# Patient Record
Sex: Male | Born: 1950 | Race: White | Hispanic: No | Marital: Married | Smoking: Never smoker
Health system: Southern US, Community
[De-identification: ages and names within clinical notes are randomized; demographics above are authoritative.]

## PROBLEM LIST (undated history)

## (undated) DIAGNOSIS — I4891 Unspecified atrial fibrillation: Secondary | ICD-10-CM

## (undated) DIAGNOSIS — F419 Anxiety disorder, unspecified: Secondary | ICD-10-CM

## (undated) DIAGNOSIS — J45909 Unspecified asthma, uncomplicated: Secondary | ICD-10-CM

## (undated) DIAGNOSIS — K219 Gastro-esophageal reflux disease without esophagitis: Secondary | ICD-10-CM

## (undated) DIAGNOSIS — G35D Multiple sclerosis, unspecified: Secondary | ICD-10-CM

## (undated) DIAGNOSIS — G35 Multiple sclerosis: Secondary | ICD-10-CM

## (undated) HISTORY — DX: Multiple sclerosis, unspecified: G35.D

## (undated) HISTORY — PX: EYE SURGERY: SHX253

## (undated) HISTORY — DX: Unspecified asthma, uncomplicated: J45.909

## (undated) HISTORY — DX: Unspecified atrial fibrillation: I48.91

## (undated) HISTORY — DX: Anxiety disorder, unspecified: F41.9

## (undated) HISTORY — DX: Multiple sclerosis: G35

## (undated) HISTORY — PX: HERNIA REPAIR: SHX51

## (undated) HISTORY — DX: Gastro-esophageal reflux disease without esophagitis: K21.9

## (undated) HISTORY — PX: SHOULDER SURGERY: SHX246

---

## 2000-04-19 DIAGNOSIS — K5732 Diverticulitis of large intestine without perforation or abscess without bleeding: Secondary | ICD-10-CM | POA: Insufficient documentation

## 2003-10-29 DIAGNOSIS — K589 Irritable bowel syndrome without diarrhea: Secondary | ICD-10-CM | POA: Insufficient documentation

## 2005-10-12 ENCOUNTER — Ambulatory Visit: Payer: Self-pay | Admitting: Internal Medicine

## 2005-10-20 ENCOUNTER — Ambulatory Visit: Payer: Self-pay | Admitting: Cardiology

## 2005-11-04 ENCOUNTER — Ambulatory Visit: Payer: Self-pay | Admitting: Internal Medicine

## 2006-04-08 DIAGNOSIS — G35 Multiple sclerosis: Secondary | ICD-10-CM | POA: Insufficient documentation

## 2006-12-06 ENCOUNTER — Ambulatory Visit: Payer: Self-pay | Admitting: Internal Medicine

## 2008-03-29 ENCOUNTER — Ambulatory Visit: Payer: Self-pay | Admitting: Internal Medicine

## 2008-04-04 ENCOUNTER — Ambulatory Visit: Payer: Self-pay | Admitting: Cardiology

## 2009-03-21 DIAGNOSIS — I480 Paroxysmal atrial fibrillation: Secondary | ICD-10-CM | POA: Insufficient documentation

## 2009-03-22 ENCOUNTER — Encounter (INDEPENDENT_AMBULATORY_CARE_PROVIDER_SITE_OTHER): Payer: Self-pay | Admitting: *Deleted

## 2009-03-22 ENCOUNTER — Ambulatory Visit: Payer: Self-pay | Admitting: Internal Medicine

## 2009-03-22 DIAGNOSIS — R030 Elevated blood-pressure reading, without diagnosis of hypertension: Secondary | ICD-10-CM | POA: Insufficient documentation

## 2009-03-22 DIAGNOSIS — G5600 Carpal tunnel syndrome, unspecified upper limb: Secondary | ICD-10-CM | POA: Insufficient documentation

## 2010-04-01 ENCOUNTER — Encounter: Payer: Self-pay | Admitting: Internal Medicine

## 2010-04-03 ENCOUNTER — Encounter: Payer: Self-pay | Admitting: Internal Medicine

## 2010-04-03 ENCOUNTER — Ambulatory Visit: Payer: Self-pay | Admitting: Internal Medicine

## 2010-04-03 DIAGNOSIS — K219 Gastro-esophageal reflux disease without esophagitis: Secondary | ICD-10-CM | POA: Insufficient documentation

## 2010-05-15 NOTE — Assessment & Plan Note (Signed)
Summary: YEARLY/SL   Visit Type:  1 year follow up Primary Provider:  prince, dan  CC:  headache-occ.  History of Present Illness: Nicholas Bolton is seen in followup for paroxysmal atrial fibrillation is primarily nocturnal. He has been taking Rythmol and has tolerated this without any difficulty. The patient denies SOB, chest pain, edema  His major problem has been GERD which was aggravated by taking Propafenone  2 times a day so he hasigone back to taking it 3 times a day     At his last visit there is a concern about chest pain. Underwent a Myoview scanning done at [facility] last December2009, with norm,al perfusion  Saw neurologist,  MRI question MS vs HTN chnages  Problems Prior to Update: 1)  Carpal Tunnel Syndrome  (ICD-354.0) 2)  Increased Blood Pressure  (ICD-796.2) 3)  Atrial Fibrillation, Paroxysmal  (ICD-427.31)  Current Medications (verified): 1)  Propafenone Hcl 150 Mg Tabs (Propafenone Hcl) .... Take One Tablet Three Times A Day 2)  Loratadine 10 Mg Tabs (Loratadine) .... Take One Tablet Once Daily 3)  Aspirin 81 Mg Tabs (Aspirin) .... Take One Tablet Daily 4)  Diazepam 5 Mg Tabs (Diazepam) .... As Needed 5)  Betaseron 0.3 Mg Solr (Interferon Beta-1b) .... Every Other Day, At Bedtime 6)  Finacea Plus 15 % Kit (Azelaic Acid-Cleanser-Lotion) .... Two Times A Day 7)  Vitamin D 1000 Unit Tabs (Cholecalciferol) .... Take 2000 International Units Two Times A Day 8)  B Complex-Vitamin B12  Tabs (B Complex-Folic Acid) .... Once Daily 9)  Muro 128 5 % Oint (Sodium Chloride (Hypertonic)) .... Once Daily At Bedtime  Allergies (verified): 1)  ! Pcn 2)  ! Keflex 3)  ! Procainamide  Past History:  Past Medical History: Last updated: 03/22/2009 Atrial fibrillation Normal stress echo ; normal nuclear study December 2009 Multiple Sclerosis Asthma GERD Anxiety  Past Surgical History: Last updated: 03/21/2009 Hernia Eye surgery Shoulder surgery  Family  History: Last updated: 03/22/2009 Negative FH of Diabetes or Coronary Artery Disease  Social History: Last updated: 03/21/2009 CEO of a [employer] Married  Tobacco Use - No.  Alcohol Use - yes Drug Use - no  Risk Factors: Smoking Status: never (03/21/2009)   Vital Signs:  Patient profile:   60 year old male Height:      69 inches Weight:      169.50 pounds BMI:     25.12 Pulse rate:   57 / minute BP sitting:   149 / 99  (left arm) Cuff size:   regular  Vitals Entered By: Caralee Ates CMA (April 03, 2010 8:44 AM)  Physical Exam  General:  The patient was alert and oriented in no acute distress. HEENT Normal.  Neck veins were flat, carotids were brisk.  Lungs were clear.  Heart sounds were regular without murmurs or gallops.  Abdomen was soft with active bowel sounds. There is no clubbing cyanosis or edema. Skin Warm and dry    Impression & Recommendations:  Problem # 1:  ATRIAL FIBRILLATION, PAROXYSMAL (ICD-427.31) his atrial fibrillation and remains paroxysmal we will continue him on his Rythmol. His CHADS VASC score is one; hence we will continue him on aspirin The following medications were removed from the medication list:    Rythmol Sr 225 Mg Xr12h-cap (Propafenone hcl) .[name]..[name] Two times a day His updated medication list for this problem includes:    Propafenone Hcl 150 Mg Tabs (Propafenone hcl) .[name]... Take one tablet three times a day  Aspirin 81 Mg Tabs (Aspirin) .[name]... Take one tablet daily  Orders: EKG w/ Interpretation (93000)  Problem # 2:  GERD (ICD-530.81) we discussed the potential for taking over-the-counter H2 blockers and PPIs. He will resume the latter  Problem # 3:  INCREASED BLOOD PRESSURE (ICD-796.2) His blood pressure significantly elevated today with a repeat systolic of 156. He says it was 120 at his last visit. He reminds me that he has a history of hypertension dating back to [place] draft days. I've asked him to take a half  dozen measurements and see his primary care physician next month  Patient Instructions: 1)  Your physician recommends that you continue on your current medications as directed. Please refer to the Current Medication list given to you today. 2)  Your physician wants you to follow-up in:1 year  You will receive a reminder letter in the mail two months in advance. If you don't receive a letter, please call our office to schedule the follow-up appointment.  Appended Document: YEARLY/SL sinus rhythm at 51 Intervals 0.22/0.10/141 Axis is -18

## 2010-08-26 NOTE — Letter (Signed)
December 06, 2006    [name], MD  [address]  [place], [place]  [address]   RE:  ISAM, UNREIN  MRN:  [id number]  /  DOB:  Aug 28, 1950   Dear Jesusita Oka:   It was a pleasure to see your friend and patient, Nicholas Bolton, today.  He told me the saga of his multiple sclerosis and that seems to be doing  pretty well. His atrial fibrillation is quite quiescent on his  propafenone. He takes aspirin.   On examination, his blood pressure was 122/72, pulse 53.  LUNGS:  Clear.  HEART: Sounds were regular.  EXTREMITIES: Were without edema.   IMPRESSION:  1. Paroxysmal atrial fibrillation.  2. Normal stress echo.  3. Intracurrent diagnosis of multiple sclerosis.   Mr. Yett is doing well. Will plan to see him again in a year and  continue on his propafenone.    Sincerely,      Duke Salvia, MD, [facility]  Electronically Signed    SCK/MedQ  DD: 12/06/2006  DT: 12/06/2006  Job #: [id number]

## 2010-08-26 NOTE — Letter (Signed)
March 29, 2008    Nicholas Minion, MD  [address]  [place], [place] [address]   RE:  Nicholas Bolton, Nicholas Bolton  MRN:  [id number]  /  DOB:  [birth year]   Dear Nicholas Bolton:   Hope this letter finds you well and hope you and your family have a  wonderful time at Christmas.   Nicholas Bolton comes in.  His atrial fibrillation has been quiescent on  propafenone.  He had an episode of chest pain that was protracted on  October 16 and went to the [facility] Room where after about  an hour and half, he was discharged because electrocardiogram was  normal.  His initial sets of enzymes were normal.   He has not had any discomfort since this episode in the emergency room.  He was not relieved by nitro, GI cocktail, or Ativan.  It is noted he  has had no recurrence.   Electrocardiogram today is unchanged.   MEDICATIONS:  1. Propafenone 150 t.i.d.  2. Loratadine.  3. Diazepam.   PHYSICAL EXAMINATION:  PHYSICAL EXAMINATION:  His blood pressure was  mildly elevated at 140/80, his pulse was 55.  NECK:  Veins were flat.  LUNGS:  Clear.  HEART:  Sounds were regular.  ABDOMEN:  Soft.  EXTREMITIES:  Without edema.   Electrocardiogram was normal as noted.   IMPRESSION:  1. Atrial fibrillation on propafenone.  2. Protracted chest pain with a stress echo couple of years ago.   Nicholas Bolton, I think the big issue with Nicholas Bolton and his protracted chest pain  in the setting of 1-C antiarrhythmic therapy.  He states he does have  underlying coronary disease with the episode of manifestation of such.  I think it was reasonable to repeat his stress test at this point with a  low threshold to undertake catheterization even if it is abnormal.   If there is anything that I can do further to help, please do not  hesitate to contact me.    Sincerely,      Nicholas Salvia, MD, [facility]  Electronically Signed    SCK/MedQ  DD: 03/29/2008  DT: 03/30/2008  Job #: [id number]

## 2012-01-08 ENCOUNTER — Encounter: Payer: Self-pay | Admitting: *Deleted

## 2012-01-08 ENCOUNTER — Encounter: Payer: Self-pay | Admitting: Internal Medicine

## 2012-03-14 ENCOUNTER — Ambulatory Visit: Payer: Self-pay | Admitting: Internal Medicine

## 2012-05-05 ENCOUNTER — Encounter: Payer: Self-pay | Admitting: Internal Medicine

## 2012-05-05 ENCOUNTER — Ambulatory Visit (INDEPENDENT_AMBULATORY_CARE_PROVIDER_SITE_OTHER): Payer: No Typology Code available for payment source | Admitting: Internal Medicine

## 2012-05-05 VITALS — BP 151/80 | HR 54 | Ht 69.5 in | Wt 162.0 lb

## 2012-05-05 DIAGNOSIS — I4891 Unspecified atrial fibrillation: Secondary | ICD-10-CM

## 2012-05-05 NOTE — Assessment & Plan Note (Signed)
Continue on his current medical regime. We discussed alternative formulations.. I would like to continue it is doing. he will also continue on low-dose aspirin.

## 2012-05-05 NOTE — Progress Notes (Signed)
Patient Care Team: Mirian Mo as PCP - General (Internal Medicine)   HPI  Nicholas Bolton is a 62 y.o. male Seen in followup for paroxysmal atrial fibrillation. He was last seen about 2-1/2 years ago. He was taking Rythmol at that time. He continues to take it 3 times a day. This regime seems to minimize GI discomfort. He's had no significant atrial fibrillation.  The patient denies SOB, chest pain edema or palpitations.  There has been no syncope or presyncope.   [facility]oview scanning [facility] at [facility] was normal.  Past Medical History  Diagnosis Date  . A-fib   . MS (multiple sclerosis)   . Asthma   . GERD (gastroesophageal reflux disease)   . Anxiety     Past Surgical History  Procedure Date  . Hernia repair   . Eye surgery   . Shoulder surgery     Current Outpatient Prescriptions  Medication Sig Dispense Refill  . aspirin 81 MG tablet Take 81 mg by mouth daily.      . Azelaic Acid-Cleanser-Lotion (FINACEA PLUS) 15 % KIT Apply topically.      . Cholecalciferol (VITAMIN D3) LIQD Inject as directed as directed.      . Cyanocobalamin (VITAMIN B-12 PO) Take by mouth.      . interferon beta-1b (BETASERON) 0.3 MG injection Inject 0.25 mg into the skin every other day.      . loratadine (CLARITIN) 10 MG tablet Take 10 mg by mouth daily.      [name] omeprazole (PRILOSEC) 20 MG capsule Take 20 mg by mouth daily.      . propafenone (RYTHMOL) 150 MG tablet Take 150 mg by mouth every 8 (eight) hours.      . sodium chloride (MURO 128) 5 % ophthalmic solution 1 drop as needed.        Allergies  Allergen Reactions  . Cephalexin   . Penicillins   . Procainamide     Review of Systems negative except from HPI and PMH  Physical Exam BP 151/80  Pulse 54  Ht 5' 9.5" (1.765 m)  Wt 162 lb (73.483 kg)  BMI 23.58 kg/m2 Well developed and nourished in no acute distress HENT normal Neck supple with JVP-flat Clear Regular rate and rhythm, no murmurs or gallops Abd-soft with active  BS No Clubbing cyanosis edema Skin-warm and dry A & Oriented  Grossly normal sensory and motor function  1 ECG demonstrates sinus rhythm at 51 Intervals 19/11/44 Axis XIII  Assessment and  Plan

## 2012-05-05 NOTE — Patient Instructions (Addendum)
Your physician wants you to follow-up in: 1 year with Dr Klein.  You will receive a reminder letter in the mail two months in advance. If you don't receive a letter, please call our office to schedule the follow-up appointment.  

## 2013-01-25 DIAGNOSIS — K633 Ulcer of intestine: Secondary | ICD-10-CM | POA: Insufficient documentation

## 2013-08-24 DIAGNOSIS — D72819 Decreased white blood cell count, unspecified: Secondary | ICD-10-CM | POA: Insufficient documentation

## 2014-01-23 ENCOUNTER — Ambulatory Visit (INDEPENDENT_AMBULATORY_CARE_PROVIDER_SITE_OTHER): Payer: BC Managed Care – PPO | Admitting: Internal Medicine

## 2014-01-23 ENCOUNTER — Encounter: Payer: Self-pay | Admitting: Internal Medicine

## 2014-01-23 VITALS — BP 142/89 | HR 54 | Ht 69.0 in | Wt 162.2 lb

## 2014-01-23 DIAGNOSIS — I480 Paroxysmal atrial fibrillation: Secondary | ICD-10-CM

## 2014-01-23 NOTE — Patient Instructions (Signed)
Your physician recommends that you continue on your current medications as directed. Please refer to the Current Medication list given to you today.  Your physician wants you to follow-up in: 1 year with Dr. Caryl Comes.  You will receive a reminder letter in the mail two months in advance. If you don't receive a letter, please call our office to schedule the follow-up appointment.  Dr. Caryl Comes recommends you use the AliveCor monitor.

## 2014-01-23 NOTE — Progress Notes (Signed)
Patient Care Team: Bonita Quin, MD as PCP - General (Internal Medicine)   HPI  Nicholas Bolton is a 63 y.o. male Seen in followup for paroxysmal atrial fibrillation. He was last seen about 2-1/2 years ago. He was taking Rythmol at that time. He continues to take it 3 times a day. This regime seems to minimize GI discomfort. He's had no significant atrial fibrillation.  He had a spell  A few weeks ago with palpitations that were sporadic   The patient denies SOB, chest pain edema  There has been no syncope or presyncope.   Myoview scanning 2009 at work hospital was normal.  Past Medical History  Diagnosis Date  . A-fib   . MS (multiple sclerosis)   . Asthma   . GERD (gastroesophageal reflux disease)   . Anxiety     Past Surgical History  Procedure Laterality Date  . Hernia repair    . Eye surgery    . Shoulder surgery      Current Outpatient Prescriptions  Medication Sig Dispense Refill  . aspirin 81 MG tablet Take 81 mg by mouth daily.      . Azelaic Acid-Cleanser-Lotion (FINACEA PLUS) 15 % KIT Apply topically.      . B Complex-Folic Acid (B COMPLEX-VITAMIN B12 PO) Take 1 tablet by mouth daily.      . Cholecalciferol (VITAMIN D-3 PO) Take 4,000 tablets by mouth every other day.      . interferon beta-1b (BETASERON) 0.3 MG injection Inject 0.25 mg into the skin every other day.      . loratadine (CLARITIN) 10 MG tablet Take 10 mg by mouth daily.      [name] omeprazole (PRILOSEC) 20 MG capsule Take 20 mg by mouth daily.      . propafenone (RYTHMOL) 150 MG tablet Take 150 mg by mouth every 8 (eight) hours.      . sodium chloride (MURO 128) 5 % ophthalmic solution 1 drop as needed.       No current facility-administered medications for this visit.    Allergies  Allergen Reactions  . Penicillins     Blood in urine   . Procainamide     Nightmares and made feel like a vice on head.  . Cephalexin     Rash     Review of Systems negative except from HPI and  PMH  Physical Exam BP 142/89  Pulse 54  Ht _0  (1.753 m)  Wt 162 lb 3.2 oz (73.573 kg)  BMI 23.94 kg/m2 Well developed and nourished in no acute distress HENT normal Neck supple with JVP-flat Clear Regular rate and rhythm, no murmurs or gallops Abd-soft with active BS No Clubbing cyanosis edema Skin-warm and dry A & Oriented  Grossly normal sensory and motor function  1 ECG demonstrates sinus rhythm at 51 Intervals 19/11/44 Axis XIII  Assessment and  Plan  Atrial fibrillation  Palpitations  Suggested using alivecor for monitoring and intentional exercise routine

## 2014-02-13 ENCOUNTER — Encounter: Payer: Self-pay | Admitting: *Deleted

## 2014-08-29 DIAGNOSIS — E041 Nontoxic single thyroid nodule: Secondary | ICD-10-CM | POA: Insufficient documentation

## 2014-09-12 DIAGNOSIS — E0590 Thyrotoxicosis, unspecified without thyrotoxic crisis or storm: Secondary | ICD-10-CM | POA: Insufficient documentation

## 2014-09-24 DIAGNOSIS — E042 Nontoxic multinodular goiter: Secondary | ICD-10-CM | POA: Insufficient documentation

## 2014-11-08 DIAGNOSIS — D649 Anemia, unspecified: Secondary | ICD-10-CM | POA: Insufficient documentation

## 2015-05-23 ENCOUNTER — Ambulatory Visit: Payer: Self-pay | Admitting: Internal Medicine

## 2015-07-02 ENCOUNTER — Encounter: Payer: Self-pay | Admitting: Internal Medicine

## 2015-07-02 ENCOUNTER — Ambulatory Visit (INDEPENDENT_AMBULATORY_CARE_PROVIDER_SITE_OTHER): Payer: Medicare Other | Admitting: Internal Medicine

## 2015-07-02 VITALS — BP 161/93 | HR 62 | Ht 69.0 in | Wt 165.8 lb

## 2015-07-02 DIAGNOSIS — I480 Paroxysmal atrial fibrillation: Secondary | ICD-10-CM

## 2015-07-02 DIAGNOSIS — E782 Mixed hyperlipidemia: Secondary | ICD-10-CM | POA: Diagnosis not present

## 2015-07-02 MED ORDER — ATORVASTATIN CALCIUM 40 MG PO TABS: 40.0000 mg | ORAL_TABLET | Freq: Every day | ORAL | Status: DC

## 2015-07-02 MED ORDER — LISINOPRIL 5 MG PO TABS: 5.0000 mg | ORAL_TABLET | Freq: Every day | ORAL | Status: DC

## 2015-07-02 NOTE — Progress Notes (Signed)
Patient Care Team: No Pcp Per Patient as PCP - General (General Practice)   HPI  Nicholas Bolton is a 65 y.o. male Seen in followup for paroxysmal atrial fibrillation. He was last seen about 2-1/2 years ago. He was taking Rythmol at that time. He continues to take it 3 times a day. This regime seems to minimize GI discomfort. He's had no significant atrial fibrillation.  He had a spell  A few weeks ago with palpitations that were sporadic   The patient denies SOB, chest pain edema  There has been no syncope or presyncope.  Some palps and review of Alive Cor suggests non sustained Atrial tacjk  Myoview scanning 2009 at work hospital was normal.  Past Medical History  Diagnosis Date  . A-fib ([place])   . MS (multiple sclerosis) ([place])   . Asthma   . GERD (gastroesophageal reflux disease)   . Anxiety     Past Surgical History  Procedure Laterality Date  . Hernia repair    . Eye surgery    . Shoulder surgery      Current Outpatient Prescriptions  Medication Sig Dispense Refill  . Azelaic Acid-Cleanser-Lotion (FINACEA PLUS) 15 % KIT Apply topically.    . B Complex-Folic Acid (B COMPLEX-VITAMIN B12 PO) Take 1 tablet by mouth daily.    . Cholecalciferol (VITAMIN D-3 PO) Take 4,000 Units by mouth every other day.     . loratadine (CLARITIN) 10 MG tablet Take 10 mg by mouth daily.    [name] omeprazole (PRILOSEC) 20 MG capsule Take 20 mg by mouth daily.    . propafenone (RYTHMOL) 150 MG tablet Take 150 mg by mouth every 8 (eight) hours.    . sodium chloride (MURO 128) 5 % ophthalmic solution 1 drop as needed.     No current facility-administered medications for this visit.    Allergies  Allergen Reactions  . Penicillins     Blood in urine   . Procainamide Rash    "pt went crazy" Nightmares and made feel like a vice on head.  . Clindamycin Hives  . Penicillin G Other (See Comments)    hematuria  . Cephalexin Other (See Comments) and Rash    Rash Rash     Review of Systems  negative except from HPI and PMH  Physical Exam BP 161/93 mmHg  Pulse 62  Ht _0  (1.753 m)  Wt 165 lb 12.8 oz (75.206 kg)  BMI 24.47 kg/m2 Well developed and nourished in no acute distress HENT normal Neck supple with JVP-flat Clear Regular rate and rhythm, no murmurs or gallops Abd-soft with active BS No Clubbing cyanosis edema Skin-warm and dry A & Oriented  Grossly normal sensory and motor function    Assessment and  Plan  Atrial fibrillation  Palpitations Hypertension  Hyperlipidemia   Labs reviewed and 10 yr calculator 18.8 % from labs 2 yrs ago  Will begin moderate dose statin  atorva 40  BP elevated  willadd lisinopril 10 will check bmet and LP with direct LDL today

## 2015-07-02 NOTE — Patient Instructions (Signed)
Medication Instructions: 1) Start lisinopril 5 mg one tablet by mouth once daily 2) Start lipitor 40 mg one tablet by mouth once daily  Labwork: - Your physician recommends that you return for lab work today: lipid/ direct LDL  Procedures/Testing: - none  Follow-Up: - Your physician wants you to follow-up in: 1 year with Dr. Caryl Comes. You will receive a reminder letter in the mail two months in advance. If you don't receive a letter, please call our office to schedule the follow-up appointment.  Any Additional Special Instructions Will Be Listed Below (If Applicable).     If you need a refill on your cardiac medications before your next appointment, please call your pharmacy.

## 2015-07-03 LAB — LIPID PANEL
Cholesterol: 203 mg/dL — ABNORMAL HIGH (ref 125–200)
HDL: 56 mg/dL (ref >=40)
LDL Cholesterol: 119 mg/dL (ref <130)
Total CHOL/HDL Ratio: 3.6 Ratio (ref <=5.0)
Triglycerides: 139 mg/dL (ref <150)
VLDL: 28 mg/dL (ref <30)

## 2015-07-03 LAB — BASIC METABOLIC PANEL
BUN: 17 mg/dL (ref 7–25)
CO2: 24 mmol/L (ref 20–31)
Calcium: 9.4 mg/dL (ref 8.6–10.3)
Chloride: 102 mmol/L (ref 98–110)
Creat: 0.96 mg/dL (ref 0.70–1.25)
Glucose, Bld: 84 mg/dL (ref 65–99)
Potassium: 4.3 mmol/L (ref 3.5–5.3)
Sodium: 137 mmol/L (ref 135–146)

## 2015-07-03 LAB — LDL CHOLESTEROL, DIRECT: Direct LDL: 129 mg/dL (ref <130)

## 2015-07-24 ENCOUNTER — Encounter: Payer: Self-pay | Admitting: Internal Medicine

## 2016-06-11 ENCOUNTER — Other Ambulatory Visit: Payer: Self-pay | Admitting: Internal Medicine

## 2017-04-14 ENCOUNTER — Ambulatory Visit: Payer: Medicare Other | Admitting: Internal Medicine

## 2017-04-15 ENCOUNTER — Encounter: Payer: Self-pay | Admitting: Internal Medicine

## 2017-04-15 ENCOUNTER — Ambulatory Visit (INDEPENDENT_AMBULATORY_CARE_PROVIDER_SITE_OTHER): Payer: Medicare Other | Admitting: Internal Medicine

## 2017-04-15 VITALS — BP 146/82 | HR 56 | Ht 68.0 in | Wt 162.0 lb

## 2017-04-15 DIAGNOSIS — K649 Unspecified hemorrhoids: Secondary | ICD-10-CM | POA: Insufficient documentation

## 2017-04-15 DIAGNOSIS — K4090 Unilateral inguinal hernia, without obstruction or gangrene, not specified as recurrent: Secondary | ICD-10-CM | POA: Insufficient documentation

## 2017-04-15 DIAGNOSIS — I480 Paroxysmal atrial fibrillation: Secondary | ICD-10-CM

## 2017-04-15 DIAGNOSIS — M5137 Other intervertebral disc degeneration, lumbosacral region: Secondary | ICD-10-CM | POA: Insufficient documentation

## 2017-04-15 DIAGNOSIS — L719 Rosacea, unspecified: Secondary | ICD-10-CM | POA: Insufficient documentation

## 2017-04-15 NOTE — Progress Notes (Signed)
Patient Care Team: Patient, No Pcp Per as PCP - General (General Practice)   HPI  Nicholas Bolton is a 67 y.o. male Seen in followup for paroxysmal atrial fibrillation. He was last seen about 2-1/2 years ago. He was taking Rythmol at that time. He continues to take it 3 times a day. This regime seems to minimize GI discomfort. He's had no significant atrial fibrillation.  Scant palpitations  No exercise intolerance, but now having significant problems with back pain which is limiting  No sob or chest pain, but wife has noted increasing edema; no pnd or orthopnea   Denies added salt, and volume intake in not excessive   Also complaining of bruising of hands,  Apparently CBC is normal  Myoview scanning 2009 at work hospital was normal.  Past Medical History:  Diagnosis Date  . A-fib ([place])   . Anxiety   . Asthma   . GERD (gastroesophageal reflux disease)   . MS (multiple sclerosis) ([place])     Past Surgical History:  Procedure Laterality Date  . EYE SURGERY    . HERNIA REPAIR    . SHOULDER SURGERY      Current Outpatient Medications  Medication Sig Dispense Refill  . Azelaic Acid-Cleanser-Lotion (FINACEA PLUS) 15 % KIT Apply topically.    . B Complex-Folic Acid (B COMPLEX-VITAMIN B12 PO) Take 1 tablet by mouth daily.    . Cholecalciferol (VITAMIN D-3 PO) Take 4,000 Units by mouth every other day.     . lisinopril (PRINIVIL,ZESTRIL) 5 MG tablet Take 1 tablet (5 mg total) by mouth daily. *Please call and schedule a one year follow up appointment* 90 tablet 0  . loratadine (CLARITIN) 10 MG tablet Take 10 mg by mouth daily.    [name] omeprazole (PRILOSEC) 20 MG capsule Take 20 mg by mouth daily.    . propafenone (RYTHMOL) 150 MG tablet Take 150 mg by mouth every 8 (eight) hours.    . sodium chloride (MURO 128) 5 % ophthalmic solution 1 drop as needed.     No current facility-administered medications for this visit.     Allergies  Allergen Reactions  . Penicillins     Blood in  urine   . Procainamide Rash    "pt went crazy" Nightmares and made feel like a vice on head.  . Clindamycin Hives  . Penicillin G Other (See Comments)    hematuria  . Cephalexin Other (See Comments) and Rash    Rash Rash     Review of Systems negative except from HPI and PMH  Physical Exam BP (!) 146/82   Pulse (!) 56   Ht 5' 8" (1.727 m)   Wt 162 lb (73.5 kg)   SpO2 98%   BMI 24.63 kg/m  Well developed and nourished in no acute distress HENT normal Neck supple with JVP-7-8 Clear Regular rate and rhythm, no murmurs or gallops Abd-soft with active BS No Clubbing cyanosis tr edema Skin-warm and dry A & Oriented  Grossly normal sensory and motor function  ECG  Sinus @ 51 21/11/45 -22  Assessment and  Plan  Atrial fibrillation-paroxysmal  Sinus bradycardia  Edema  Hypertension  Hyperlipidemia   BP reasonably controlled at home.  With evidence of mild volume overload, would consider switching to chlorthalidone, but he had previously developed hypokalemia-- after going back and forth, have settled on prn low dose furosemide  His back pain limits exercise, so not sure if his fluid overload is symptomatic or not  Would consider  Ca scoring in this asymptomatic man with a 5-20% to see if he is in the subset of score 0 in whom we could stop statins  I called him to discuss this on the phone and he is agreeable   More than 50% of 40 min was spent in counseling related to the above (including phone call)

## 2017-04-15 NOTE — Patient Instructions (Signed)
Medication Instructions: Your physician recommends that you continue on your current medications as directed. Please refer to the Current Medication list given to you today.  Labwork: None Ordered  Procedures/Testing: None Ordered  Follow-Up: Your physician wants you to follow-up in 1 YEAR with Dr. Klein. You will receive a reminder letter in the mail two months in advance. If you don't receive a letter, please call our office to schedule the follow-up appointment.   If you need a refill on your cardiac medications before your next appointment, please call your pharmacy.   

## 2017-05-14 ENCOUNTER — Telehealth: Payer: Self-pay | Admitting: *Deleted

## 2017-05-14 NOTE — Telephone Encounter (Signed)
-----   Message from Deboraha Sprang, MD sent at 04/15/2017  6:07 PM EST ----- H  He is going to call to get [place] to set up calcium score to see if he fits into the group who can COME OFF statin

## 2017-06-08 ENCOUNTER — Encounter: Payer: Self-pay | Admitting: Internal Medicine

## 2017-06-08 DIAGNOSIS — I480 Paroxysmal atrial fibrillation: Secondary | ICD-10-CM

## 2017-07-08 ENCOUNTER — Telehealth: Payer: Self-pay

## 2017-07-08 NOTE — Telephone Encounter (Signed)
Opened in error

## 2017-07-28 ENCOUNTER — Ambulatory Visit (INDEPENDENT_AMBULATORY_CARE_PROVIDER_SITE_OTHER)
Admission: RE | Admit: 2017-07-28 | Discharge: 2017-07-28 | Disposition: A | Discharge status: Home / Self Care | Payer: Medicare Other | Source: Ambulatory Visit | Attending: Internal Medicine | Admitting: Internal Medicine

## 2017-07-28 DIAGNOSIS — I480 Paroxysmal atrial fibrillation: Secondary | ICD-10-CM

## 2018-04-29 ENCOUNTER — Ambulatory Visit (INDEPENDENT_AMBULATORY_CARE_PROVIDER_SITE_OTHER): Payer: Medicare Other | Admitting: Internal Medicine

## 2018-04-29 ENCOUNTER — Encounter: Payer: Self-pay | Admitting: Internal Medicine

## 2018-04-29 VITALS — BP 126/82 | HR 51 | Ht 68.0 in | Wt 161.2 lb

## 2018-04-29 DIAGNOSIS — I480 Paroxysmal atrial fibrillation: Secondary | ICD-10-CM | POA: Diagnosis not present

## 2018-04-29 NOTE — Patient Instructions (Signed)
Medication Instructions: Your physician recommends that you continue on your current medications as directed. Please refer to the Current Medication list given to you today.   Labwork: None ordered today  Procedures/Testing: None ordered today  Follow-Up:  1 year with Dr. Klein  Any Additional Special Instructions Will Be Listed Below (If Applicable).     If you need a refill on your cardiac medications before your next appointment, please call your pharmacy.   

## 2018-04-29 NOTE — Progress Notes (Signed)
Patient Care Team: Eber Hong, MD as PCP - General (Internal Medicine)   HPI  Nicholas Bolton is a 68 y.o. male Seen in followup for paroxysmal atrial fibrillation takes Rythmol started 68 year old youngster then The patient denies chest pain, shortness of breath, nocturnal dyspnea, orthopnea  There have been no palpitations, lightheadedness or syncope.   Some end of the day edema.  Salt averse.  Some bruisability on his hands.  No anticoagulants.   Myoview scanning 2009 at work hospital was normal.   DATE PR interval QRSduration Propafenone   1/20 234 102 150 x3   Ca score 2  MS quiescient  Past Medical History:  Diagnosis Date  . A-fib ([place])   . Anxiety   . Asthma   . GERD (gastroesophageal reflux disease)   . MS (multiple sclerosis) ([place])     Past Surgical History:  Procedure Laterality Date  . EYE SURGERY    . HERNIA REPAIR    . SHOULDER SURGERY      Current Outpatient Medications  Medication Sig Dispense Refill  . atorvastatin (LIPITOR) 10 MG tablet Take 1 tablet by mouth daily.    . B Complex-Folic Acid (B COMPLEX-VITAMIN B12 PO) Take 1 tablet by mouth daily.    . Cholecalciferol (VITAMIN D3) 25 MCG (1000 UT) CAPS Take 2,000 Units by mouth daily.    . famotidine (PEPCID) 20 MG tablet Take 1 tablet by mouth daily.    [name] lisinopril (PRINIVIL,ZESTRIL) 5 MG tablet Take 1 tablet by mouth daily.    [name] loratadine (CLARITIN) 10 MG tablet Take 10 mg by mouth daily.    . propafenone (RYTHMOL) 150 MG tablet Take 150 mg by mouth every 8 (eight) hours.    . sodium chloride (MURO 128) 5 % ophthalmic ointment Place 0.5 application into both eyes at bedtime as needed.    . SOOLANTRA 1 % CREA Apply 1 application topically daily. to face     No current facility-administered medications for this visit.     Allergies  Allergen Reactions  . Penicillins     Blood in urine   . Procainamide Rash    "pt went crazy" Nightmares and made feel like a vice on head.  .  Clindamycin Hives  . Penicillin G Other (See Comments)    hematuria  . Cephalexin Other (See Comments) and Rash    Rash Rash     Review of Systems negative except from HPI and PMH  Physical Exam BP 126/82   Pulse (!) 51   Ht 5\' 8"  (1.727 m)   Wt 161 lb 3.2 oz (73.1 kg)   SpO2 99%   BMI 24.51 kg/m  Well developed and nourished in no acute distress HENT normal Neck supple with JVP-flat Clear Regular rate and rhythm, no murmurs or gallops Abd-soft with active BS No Clubbing cyanosis edema Skin-warm and dry A & Oriented  Grossly normal sensory and motor function   ECG  Sinus @ 52 23/10/43 Assessment and  Plan  Atrial fibrillation-paroxysmal  Sinus bradycardia  Edema  Hypertension  Hyperlipidemia    BP well controlled  No afib of note   Will need discuss anticoagulation

## 2019-05-08 IMAGING — CT CT HEART SCORING
2 series · 16 of 20 positions shown, 18 images · non-contrast
Comparison: None.

EXAM:
OVER-READ INTERPRETATION  CT CHEST

The following report is an over-read performed by radiologist Dr.
over-read does not include interpretation of cardiac or coronary
anatomy or pathology. The calcium score interpretation by the
cardiologist is attached.
CLINICAL DATA: Risk stratification
Coronary Calcium Score
TECHNIQUE: The patient was scanned on a Siemens Somatom 64 slice scanner. Axial
non-contrast 3mm slices were carried out through the heart. The data
set was analyzed on a dedicated work station and scored using the
Agatson method.

[Series 2: casc 3.0 i36f 2 bestdiast 67 % · axial · 0.39mm/px · z∈[-243,-138]mm · 8 of 47 slices shown, 10 images]
[im 6/47  vessel]
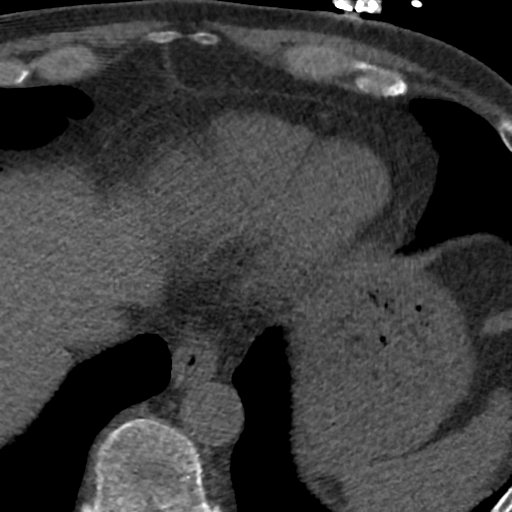
[im 6/47  lung]
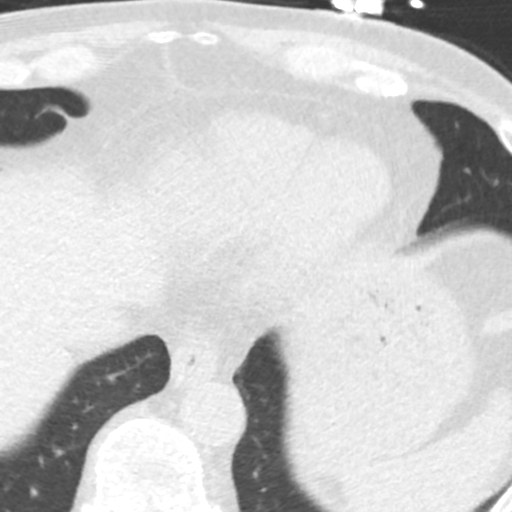
[im 11/47  vessel]
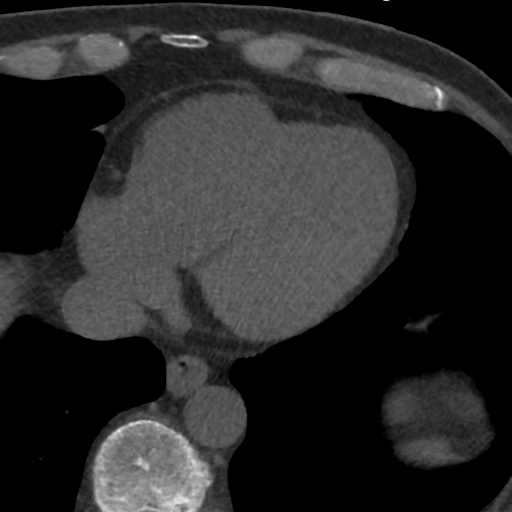
[im 16/47  vessel]
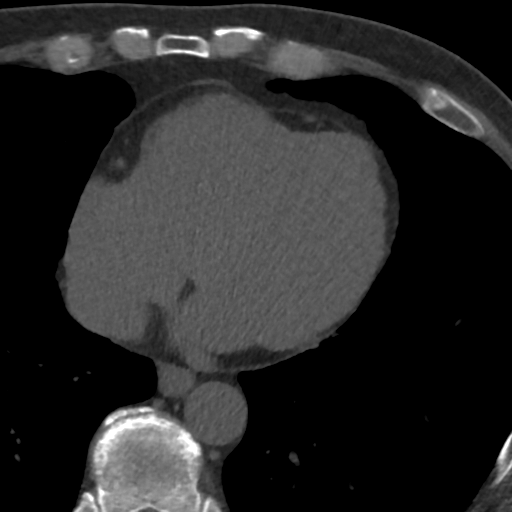
[im 21/47  vessel]
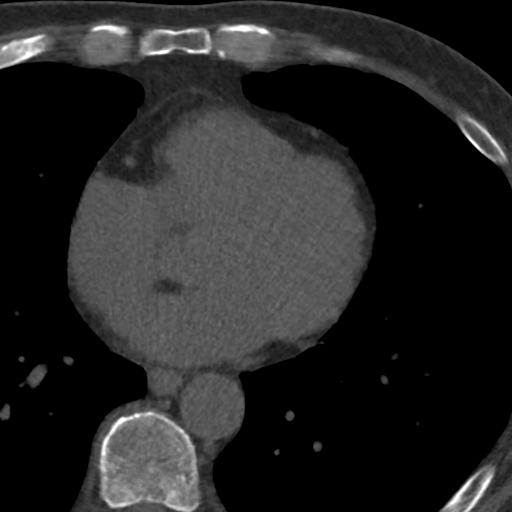
[im 26/47  vessel]
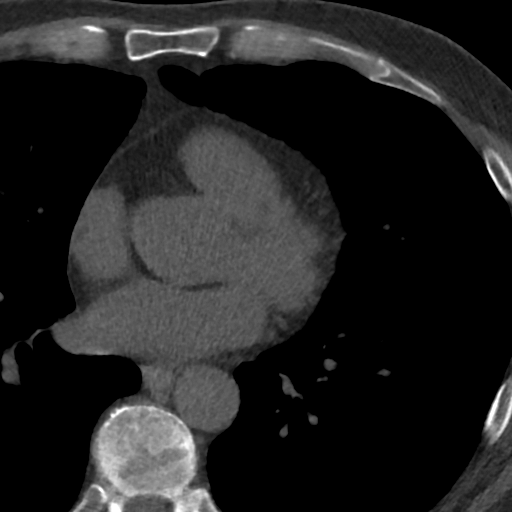
[im 26/47  lung]
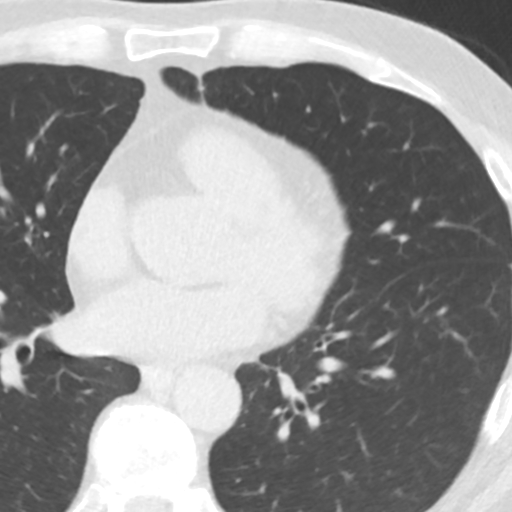
[im 31/47  vessel]
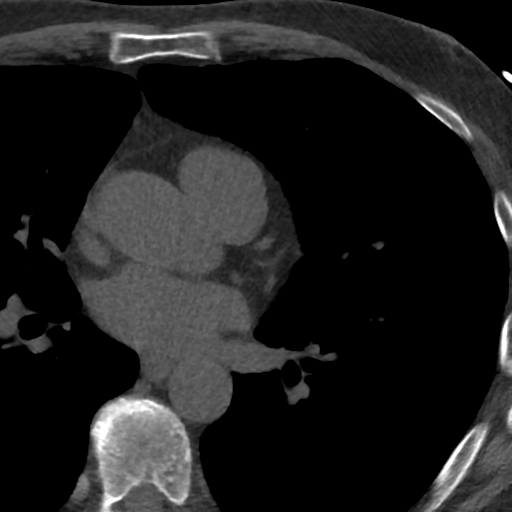
[im 36/47  vessel]
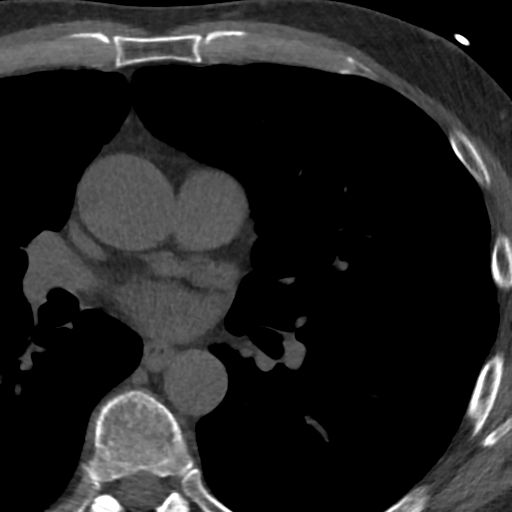
[im 41/47  vessel]
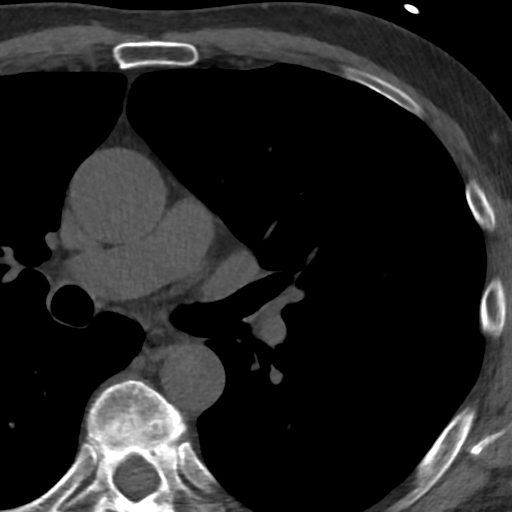

[Series 4: lung st 67 % · axial · 0.68mm/px · z∈[-246,-138]mm · 8 of 48 slices shown]
[im 6/48  lung]
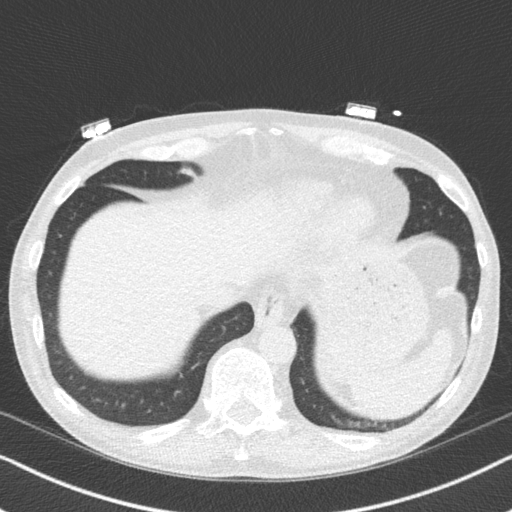
[im 11/48  lung]
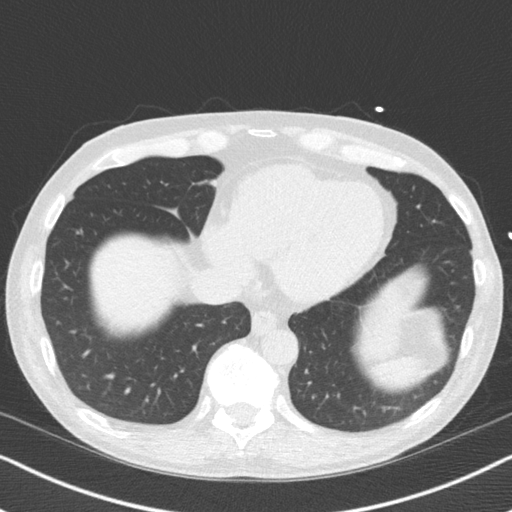
[im 16/48  lung]
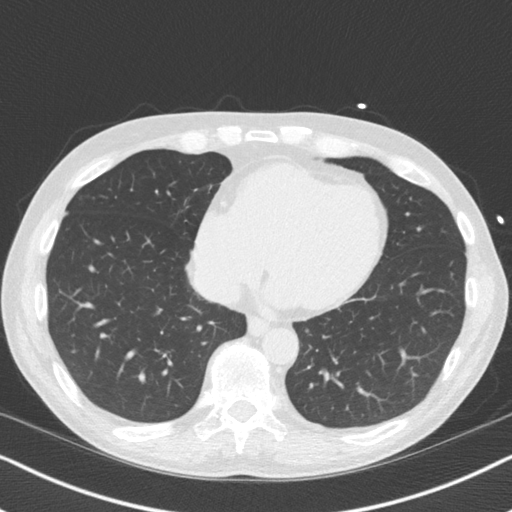
[im 21/48  lung]
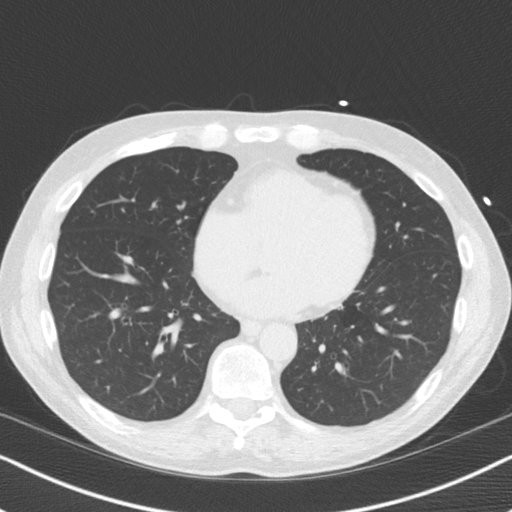
[im 27/48  lung]
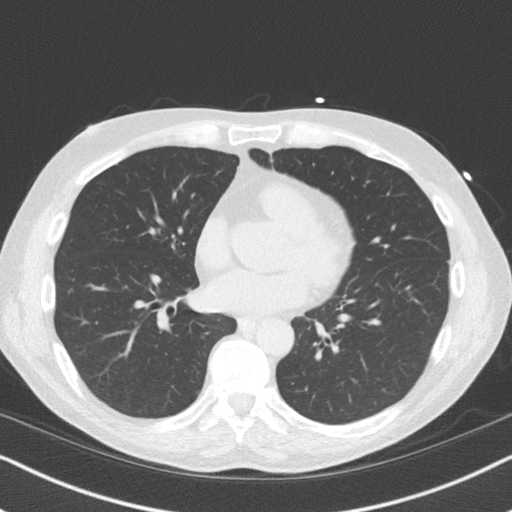
[im 32/48  lung]
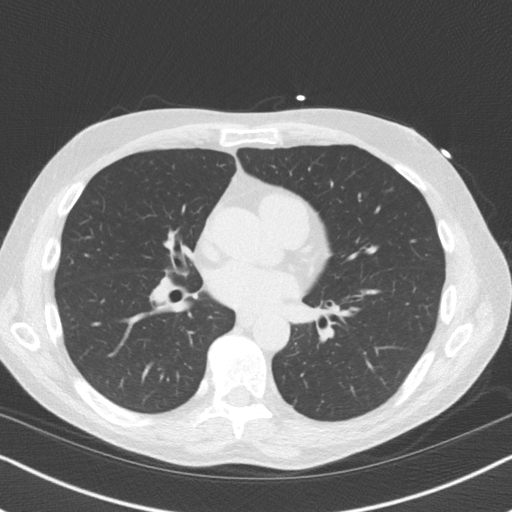
[im 37/48  lung]
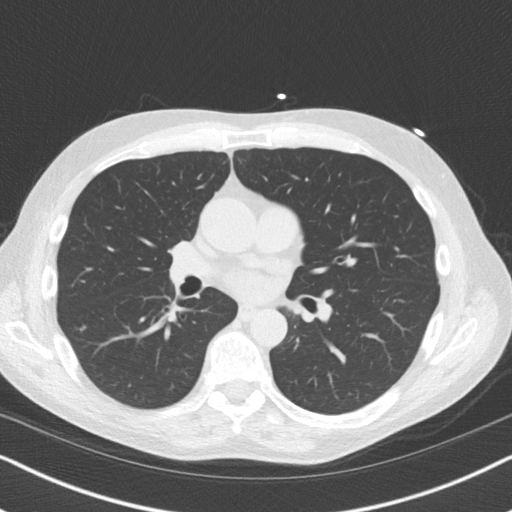
[im 42/48  lung]
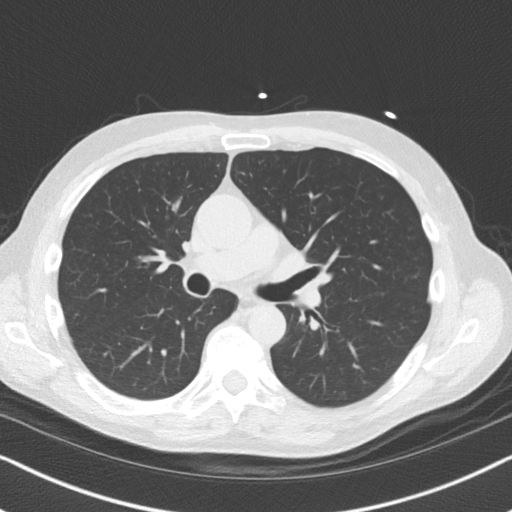

[16 of 20 positions shown; findings below may reference images not displayed]

FINDINGS: Limited view of the lung parenchyma demonstrates no suspicious
nodularity. Airways are normal.

Limited view of the mediastinum demonstrates no adenopathy.
Esophagus normal.

Limited view of the upper abdomen unremarkable.

Limited view of the skeleton and chest wall is unremarkable.
IMPRESSION: No significant extracardiac findings
FINDINGS: Non-cardiac: No significant non cardiac findings on limited lung and
soft tissue windows. See separate report from [REDACTED].

Ascending Aorta: Upper limits normal diameter 3.7 cm

Pericardium: Normal

Coronary arteries: One small punctate area of calcium noted in
proximal LAD and one in proximal RCA
IMPRESSION: Coronary calcium score of 2. This was 20 th percentile for age and
sex matched control.

Keturah Custodio

## 2019-11-13 ENCOUNTER — Ambulatory Visit (INDEPENDENT_AMBULATORY_CARE_PROVIDER_SITE_OTHER): Payer: Medicare Other | Admitting: Dermatology

## 2019-11-13 ENCOUNTER — Other Ambulatory Visit: Payer: Self-pay

## 2019-11-13 ENCOUNTER — Encounter: Payer: Self-pay | Admitting: Dermatology

## 2019-11-13 DIAGNOSIS — D225 Melanocytic nevi of trunk: Secondary | ICD-10-CM | POA: Diagnosis not present

## 2019-11-13 DIAGNOSIS — D485 Neoplasm of uncertain behavior of skin: Secondary | ICD-10-CM

## 2019-11-13 DIAGNOSIS — D229 Melanocytic nevi, unspecified: Secondary | ICD-10-CM

## 2019-11-13 NOTE — Progress Notes (Signed)
X 2 horns on left nostril. Will address at th next visit.

## 2019-11-13 NOTE — Patient Instructions (Signed)

## 2019-11-16 ENCOUNTER — Telehealth: Payer: Self-pay

## 2019-11-16 NOTE — Telephone Encounter (Signed)
-----   Message from [name], MD sent at 11/16/2019  6:58 AM EDT ----- Schedule surgery with Dr. Darene Lamer

## 2019-12-15 ENCOUNTER — Other Ambulatory Visit: Payer: Self-pay

## 2019-12-15 ENCOUNTER — Encounter: Payer: Self-pay | Admitting: Internal Medicine

## 2019-12-15 ENCOUNTER — Ambulatory Visit (INDEPENDENT_AMBULATORY_CARE_PROVIDER_SITE_OTHER): Payer: Medicare Other | Admitting: Internal Medicine

## 2019-12-15 VITALS — BP 128/80 | HR 58 | Ht 68.5 in | Wt 159.6 lb

## 2019-12-15 DIAGNOSIS — I480 Paroxysmal atrial fibrillation: Secondary | ICD-10-CM

## 2019-12-15 NOTE — Patient Instructions (Signed)
Medication Instructions:  Your physician recommends that you continue on your current medications as directed. Please refer to the Current Medication list given to you today.  *If you need a refill on your cardiac medications before your next appointment, please call your pharmacy*   Lab Work: none If you have labs (blood work) drawn today and your tests are completely normal, you will receive your results only by:  [place] (if you have MyChart) OR  A paper copy in the mail If you have any lab test that is abnormal or we need to change your treatment, we will call you to review the results.   Testing/Procedures: none   Follow-Up: At [facility], you and your health needs are our priority.  As part of our continuing mission to provide you with exceptional heart care, we have created designated Provider Care Teams.  These Care Teams include your primary Cardiologist (physician) and Advanced Practice Providers (APPs -  Physician Assistants and Nurse Practitioners) who all work together to provide you with the care you need, when you need it.  We recommend signing up for the patient portal called "MyChart".  Sign up information is provided on this After Visit Summary.  MyChart is used to connect with patients for Virtual Visits (Telemedicine).  Patients are able to view lab/test results, encounter notes, upcoming appointments, etc.  Non-urgent messages can be sent to your provider as well.   To learn more about what you can do with MyChart, go to [email or web address].    Your next appointment:   1 year(s)  The format for your next appointment:   In Person  Provider:   Virl Axe, MD   Other Instructions

## 2019-12-15 NOTE — Progress Notes (Signed)
Patient Care Team: Eber Hong, MD as PCP - General (Internal Medicine)   HPI  Nicholas Bolton is a 69 y.o. male Seen in followup for paroxysmal atrial fibrillation takes Rythmol started 1994   The patient denies chest pain, shortness of breath, nocturnal dyspnea, orthopne or peripheral edema  .  There have been no  lightheadedness or syncope.    Palpitations 4/21  ;lasted minutes, using apple watch>> AIVR   Thromboembolic risk factors ( age -77, HTN-2 ) for a CHADSVASc Score of >=2   Myoview scanning 2009 at work hospital was normal.   DATE PR interval QRSduration Propafenone   10/10 202 120 0  1/20 234 102 150 x3  9/21 222 124    Date Cr K TSH Hgb  7/20 1.07 4.4  14.2  2/21      4.22       Ca score 2  MS quiescient  Past Medical History:  Diagnosis Date  . A-fib ([place])   . Anxiety   . Asthma   . GERD (gastroesophageal reflux disease)   . MS (multiple sclerosis) ([name])     Past Surgical History:  Procedure Laterality Date  . EYE SURGERY    . HERNIA REPAIR    . SHOULDER SURGERY      Current Outpatient Medications  Medication Sig Dispense Refill  . atorvastatin (LIPITOR) 10 MG tablet Take 1 tablet by mouth daily.    . B Complex-Folic Acid (B COMPLEX-VITAMIN B12 PO) Take 1 tablet by mouth daily.    . Cholecalciferol (VITAMIN D3) 25 MCG (1000 UT) CAPS Take 2,000 Units by mouth daily.    . famotidine (PEPCID) 20 MG tablet Take 1 tablet by mouth daily.    [name] lisinopril (PRINIVIL,ZESTRIL) 5 MG tablet Take 1 tablet by mouth daily.    [name] loratadine (CLARITIN) 10 MG tablet Take 10 mg by mouth daily.    . propafenone (RYTHMOL) 150 MG tablet Take 150 mg by mouth every 8 (eight) hours.    . sodium chloride (MURO 128) 5 % ophthalmic ointment Place 0.5 application into both eyes at bedtime as needed.    . SOOLANTRA 1 % CREA Apply 1 application topically daily. to face     No current facility-administered medications for this visit.    Allergies  Allergen Reactions  .  Penicillins     Blood in urine   . Procainamide Rash    "pt went crazy" Nightmares and made feel like a vice on head.  . Clindamycin Hives  . Penicillin G Other (See Comments)    hematuria  . Cephalexin Other (See Comments) and Rash    Rash Rash     Review of Systems negative except from HPI and PMH  Physical Exam BP 128/80   Pulse (!) 58   Ht 5' 8.5" (1.74 m)   Wt 159 lb 9.6 oz (72.4 kg)   SpO2 99%   BMI 23.91 kg/m  Well developed and nourished in no acute distress HENT normal Neck supple with JVP-  Flat  Clear Regular rate and rhythm, no murmurs or gallops Abd-soft with active BS No Clubbing cyanosis edema Skin-warm and dry A & Oriented  Grossly normal sensory and motor function  ECG *sinus 55 22/1245 Assessment and  Plan  Atrial fibrillation-paroxysmal/SCAF  Sinus bradycardia  AIVR  Edema  Hypertension  Hyperlipidemia   AIVR noted,  UptoDate, nothing specific and without symptoms to suggest ischemia  Tolerating propafenone and no interval atrial fibrillation of which he is  aware  Chronic edema-- salt deplete diet-- stable so will follow   Could add HCT to lisinopril   Needs BMET and will order

## 2019-12-22 ENCOUNTER — Encounter: Payer: Self-pay | Admitting: Dermatology

## 2019-12-22 NOTE — Progress Notes (Signed)
Follow-Up Visit   Subjective  Nicholas Bolton is a 69 y.o. male who presents for the following: No chief complaint on file..  Growths Location: Left upper forehead and left sideburn Duration:  Quality:  Associated Signs/Symptoms: Modifying Factors:  Severity:  Timing: Context:   Objective  Well appearing patient in no apparent distress; mood and affect are within normal limits.     Assessment & Plan    Neoplasm of uncertain behavior of skin (2) Left Frontal Scalp  Skin / nail biopsy Type of biopsy: tangential   Informed consent: discussed and consent obtained   Timeout: patient name, date of birth, surgical site, and procedure verified   Procedure prep:  Patient was prepped and draped in usual sterile fashion Prep type:  Chlorhexidine Anesthesia: the lesion was anesthetized in a standard fashion   Anesthetic:  1% lidocaine w/ epinephrine 1-100,000 local infiltration Instrument used: flexible razor blade   Hemostasis achieved with: ferric subsulfate   Outcome: patient tolerated procedure well   Post-procedure details: wound care instructions given    Specimen 2 - Surgical pathology Differential Diagnosis: scc vs bcc Check Margins: No  Left Zygomatic Area  Skin / nail biopsy Type of biopsy: tangential   Informed consent: discussed and consent obtained   Timeout: patient name, date of birth, surgical site, and procedure verified   Procedure prep:  Patient was prepped and draped in usual sterile fashion Prep type:  Chlorhexidine Anesthesia: the lesion was anesthetized in a standard fashion   Anesthetic:  1% lidocaine w/ epinephrine 1-100,000 local infiltration Instrument used: flexible razor blade   Hemostasis achieved with: ferric subsulfate   Outcome: patient tolerated procedure well   Post-procedure details: wound care instructions given   Additional details:  Location of this lesion initially incorrectly said to be in the scalp was actually just below  the left sideburn.  We labeled as being zygomatic area which is also not precise.  Nevus Mid Back  Annual skin examination.     I, [name], MD, have reviewed all documentation for this visit.  The documentation on 12/22/19 for the exam, diagnosis, procedures, and orders are all accurate and complete.   Assessment & Plan    Neoplasm of uncertain behavior of skin (2) Left Frontal Scalp  Skin / nail biopsy Type of biopsy: tangential   Informed consent: discussed and consent obtained   Timeout: patient name, date of birth, surgical site, and procedure verified   Procedure prep:  Patient was prepped and draped in usual sterile fashion Prep type:  Chlorhexidine Anesthesia: the lesion was anesthetized in a standard fashion   Anesthetic:  1% lidocaine w/ epinephrine 1-100,000 local infiltration Instrument used: flexible razor blade   Hemostasis achieved with: ferric subsulfate   Outcome: patient tolerated procedure well   Post-procedure details: wound care instructions given    Specimen 2 - Surgical pathology Differential Diagnosis: scc vs bcc Check Margins: No  Left Zygomatic Area  Skin / nail biopsy Type of biopsy: tangential   Informed consent: discussed and consent obtained   Timeout: patient name, date of birth, surgical site, and procedure verified   Procedure prep:  Patient was prepped and draped in usual sterile fashion Prep type:  Chlorhexidine Anesthesia: the lesion was anesthetized in a standard fashion   Anesthetic:  1% lidocaine w/ epinephrine 1-100,000 local infiltration Instrument used: flexible razor blade   Hemostasis achieved with: ferric subsulfate   Outcome: patient tolerated procedure well   Post-procedure details: wound care instructions given  Additional details:  Location of this lesion initially incorrectly said to be in the scalp was actually just below the left sideburn.  We labeled as being zygomatic area which is also not  precise.  Nevus Mid Back  Annual skin examination.     I, [name], MD, have reviewed all documentation for this visit.  The documentation on 12/22/19 for the exam, diagnosis, procedures, and orders are all accurate and complete.

## 2019-12-25 NOTE — Telephone Encounter (Signed)
Called patient in regards to his my chart message about his pathology report. He already has an appointment scheduled for his surgery for left temporal scalp.

## 2020-01-25 ENCOUNTER — Other Ambulatory Visit: Payer: Self-pay

## 2020-01-25 ENCOUNTER — Ambulatory Visit (INDEPENDENT_AMBULATORY_CARE_PROVIDER_SITE_OTHER): Payer: Medicare Other | Admitting: Dermatology

## 2020-01-25 ENCOUNTER — Encounter: Payer: Self-pay | Admitting: Dermatology

## 2020-01-25 DIAGNOSIS — C4492 Squamous cell carcinoma of skin, unspecified: Secondary | ICD-10-CM

## 2020-01-25 DIAGNOSIS — L570 Actinic keratosis: Secondary | ICD-10-CM

## 2020-01-25 DIAGNOSIS — C44329 Squamous cell carcinoma of skin of other parts of face: Secondary | ICD-10-CM | POA: Diagnosis not present

## 2020-01-25 NOTE — Progress Notes (Signed)
   Follow-Up Visit   Subjective  Nicholas Bolton is a 69 y.o. male who presents for the following: Procedure (here for treatment- left temporal scalp).  CIS left face Location:  Duration:  Quality:  Associated Signs/Symptoms: Modifying Factors:  Severity:  Timing: Context:   Objective  Well appearing patient in no apparent distress; mood and affect are within normal limits.  A focused examination was performed including head and neck. Relevant physical exam findings are noted in the Assessment and Plan.   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  Destruction of lesion Complexity: simple   Destruction method: electrodesiccation and curettage   Informed consent: discussed and consent obtained   Timeout:  patient name, date of birth, surgical site, and procedure verified Anesthesia: the lesion was anesthetized in a standard fashion   Anesthetic:  1% lidocaine w/ epinephrine 1-100,000 local infiltration Curettage performed in three different directions: Yes   Curettage cycles:  3 Lesion length (cm):  1.3 Lesion width (cm):  1 Margin per side (cm):  0 Final wound size (cm):  1.3 Hemostasis achieved with:  ferric subsulfate Outcome: patient tolerated procedure well with no complications   Post-procedure details: wound care instructions given   Additional details:  Wound innoculated with 5 fluorouracil solution.  Frontal scalp lesion showed NO "root"; sideburn area with superficial extension infero-medical.  AK (actinic keratosis) (4) Left Parotid Area (2); Right Temporal Scalp (2)  Destruction of lesion - Left Parotid Area, Right Temporal Scalp Complexity: simple   Destruction method: cryotherapy   Informed consent: discussed and consent obtained   Timeout:  patient name, date of birth, surgical site, and procedure verified Lesion destroyed using liquid nitrogen: Yes   Cryotherapy cycles:  1 Outcome: patient tolerated procedure well  with no complications   Post-procedure details: wound care instructions given        I, [name], MD, have reviewed all documentation for this visit.  The documentation on 01/25/20 for the exam, diagnosis, procedures, and orders are all accurate and complete.

## 2020-01-25 NOTE — Patient Instructions (Signed)

## 2020-03-13 DIAGNOSIS — C61 Malignant neoplasm of prostate: Secondary | ICD-10-CM

## 2020-03-13 HISTORY — DX: Malignant neoplasm of prostate: C61

## 2020-03-26 ENCOUNTER — Encounter: Payer: Self-pay | Admitting: Dermatology

## 2020-03-26 ENCOUNTER — Other Ambulatory Visit: Payer: Self-pay

## 2020-03-26 ENCOUNTER — Ambulatory Visit (INDEPENDENT_AMBULATORY_CARE_PROVIDER_SITE_OTHER): Payer: Medicare Other | Admitting: Dermatology

## 2020-03-26 DIAGNOSIS — L570 Actinic keratosis: Secondary | ICD-10-CM

## 2020-03-26 DIAGNOSIS — D044 Carcinoma in situ of skin of scalp and neck: Secondary | ICD-10-CM

## 2020-03-26 DIAGNOSIS — D049 Carcinoma in situ of skin, unspecified: Secondary | ICD-10-CM

## 2020-03-30 ENCOUNTER — Encounter: Payer: Self-pay | Admitting: Dermatology

## 2020-03-30 NOTE — Progress Notes (Signed)
   Follow-Up Visit   Subjective  Nicholas Bolton is a 69 y.o. male who presents for the following: Follow-up (4 month- left temporal scalp- no concerns, healing good).  Check spots on face Location:  Duration:  Quality:  Associated Signs/Symptoms: Modifying Factors:  Severity:  Timing: Context: History superficial carcinoma left temple  Objective  Well appearing patient in no apparent distress; mood and affect are within normal limits. Objective  Left Temporal scalp: Follow up on SCC in situ. Was treated in our office and patient is having no concerns.  No sign of residual skin cancer and essentially no scarring.  Objective  Left Buccal Cheek: 2 mm subtle scale, historically stable and not bothersome to patient   A focused examination was performed including Head and neck.. Relevant physical exam findings are noted in the Assessment and Plan.   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  Annual skin examination  AK (actinic keratosis) Left Buccal Cheek  Return if clinically changes     I, [name], MD, have reviewed all documentation for this visit.  The documentation on 03/30/20 for the exam, diagnosis, procedures, and orders are all accurate and complete.

## 2020-05-08 ENCOUNTER — Ambulatory Visit: Payer: Medicare Other

## 2020-09-24 ENCOUNTER — Ambulatory Visit (INDEPENDENT_AMBULATORY_CARE_PROVIDER_SITE_OTHER): Payer: Medicare Other | Admitting: Dermatology

## 2020-09-24 ENCOUNTER — Other Ambulatory Visit: Payer: Self-pay

## 2020-09-24 DIAGNOSIS — Z1283 Encounter for screening for malignant neoplasm of skin: Secondary | ICD-10-CM | POA: Diagnosis not present

## 2020-09-24 DIAGNOSIS — Z85828 Personal history of other malignant neoplasm of skin: Secondary | ICD-10-CM

## 2020-09-24 DIAGNOSIS — B078 Other viral warts: Secondary | ICD-10-CM | POA: Diagnosis not present

## 2020-09-24 DIAGNOSIS — D0439 Carcinoma in situ of skin of other parts of face: Secondary | ICD-10-CM | POA: Diagnosis not present

## 2020-09-24 DIAGNOSIS — D485 Neoplasm of uncertain behavior of skin: Secondary | ICD-10-CM

## 2020-09-24 NOTE — Patient Instructions (Signed)

## 2020-09-28 ENCOUNTER — Encounter: Payer: Self-pay | Admitting: Dermatology

## 2020-09-28 NOTE — Progress Notes (Signed)
   Follow-Up Visit   Subjective  Nicholas Bolton is a 70 y.o. male who presents for the following: Follow-up (6 month follow up on left temple of scalp. /Has spots above left eye brow, side burn areas, ears, and left side scalp, nose.  and right foot. ).  Recheck surgical site left temple, several other places to check Location:  Duration:  Quality:  Associated Signs/Symptoms: Modifying Factors:  Severity:  Timing: Context:   Objective  Well appearing patient in no apparent distress; mood and affect are within normal limits. Right 2nd Metatarsal Plantar Area Wart versus clavus, historically some pain  Left Tip of Nose Pearly 8 mm papule left nostril, rule out BCC     Left Temple No sign residual skin cancer  Torso - Posterior (Back) General skin examination, no atypical pigmented lesions    All skin waist up examined.   Assessment & Plan    Other viral warts Right 2nd Metatarsal Plantar Area  Pare plus application DCA.  Neoplasm of uncertain behavior of skin Left Tip of Nose  Skin / nail biopsy Type of biopsy: tangential   Informed consent: discussed and consent obtained   Timeout: patient name, date of birth, surgical site, and procedure verified   Procedure prep:  Patient was prepped and draped in usual sterile fashion (Non sterile) Prep type:  Chlorhexidine Anesthesia: the lesion was anesthetized in a standard fashion   Anesthetic:  1% lidocaine w/ epinephrine 1-100,000 local infiltration Instrument used: flexible razor blade   Outcome: patient tolerated procedure well   Post-procedure details: wound care instructions given    Specimen 1 - Surgical pathology Differential Diagnosis: bcc scc ak  Check Margins: No  Personal history of skin cancer Left Temple  Recheck as needed  Encounter for screening for malignant neoplasm of skin Torso - Posterior (Back)  Annual skin examination.      I, [name], MD, have reviewed all  documentation for this visit.  The documentation on 09/28/20 for the exam, diagnosis, procedures, and orders are all accurate and complete.

## 2020-10-01 ENCOUNTER — Telehealth: Payer: Self-pay

## 2020-10-01 NOTE — Telephone Encounter (Signed)
Path to patient 8/4 surgery made

## 2020-10-01 NOTE — Telephone Encounter (Signed)
-----   Message from [name], MD sent at 10/01/2020  6:02 AM EDT ----- Please let Gerald Stabs know that because the microscopic showed a superficial rather than invasive nonmelanoma skin cancer, I would like him to schedule for minor surgery with me. ----- Message ----- From: Interface, Lab In [facility] Sent: 09/30/2020   5:43 PM EDT To: [name], MD

## 2020-11-14 ENCOUNTER — Ambulatory Visit (INDEPENDENT_AMBULATORY_CARE_PROVIDER_SITE_OTHER): Payer: Medicare Other | Admitting: Dermatology

## 2020-11-14 ENCOUNTER — Encounter: Payer: Self-pay | Admitting: Dermatology

## 2020-11-14 ENCOUNTER — Other Ambulatory Visit: Payer: Self-pay

## 2020-11-14 DIAGNOSIS — D0439 Carcinoma in situ of skin of other parts of face: Secondary | ICD-10-CM

## 2020-11-14 DIAGNOSIS — D049 Carcinoma in situ of skin, unspecified: Secondary | ICD-10-CM

## 2020-11-14 NOTE — Patient Instructions (Signed)

## 2020-12-02 ENCOUNTER — Encounter: Payer: Self-pay | Admitting: Dermatology

## 2020-12-02 NOTE — Progress Notes (Signed)
   Follow-Up Visit   Subjective  Nicholas Bolton is a 70 y.o. male who presents for the following: Procedure (Patient here today for CIS x 1 left tip of nose).  CIS nose Location:  Duration:  Quality:  Associated Signs/Symptoms: Modifying Factors:  Severity:  Timing: Context:   Objective  Well appearing patient in no apparent distress; mood and affect are within normal limits. Left Tip of Nose Lesion identified by nurse and Dr. Denna Haggard in room.     A focused examination was performed including head and neck. Relevant physical exam findings are noted in the Assessment and Plan.   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  Destruction of lesion Complexity: simple   Destruction method: electrodesiccation and curettage   Informed consent: discussed and consent obtained   Timeout:  patient name, date of birth, surgical site, and procedure verified Anesthesia: the lesion was anesthetized in a standard fashion   Anesthetic:  1% lidocaine w/ epinephrine 1-100,000 local infiltration Curettage performed in three different directions: Yes   Electrodesiccation performed over the curetted area: Yes   Curettage cycles:  3 Lesion length (cm):  1.1 Lesion width (cm):  1.2 Margin per side (cm):  0 Final wound size (cm):  1.2 Hemostasis achieved with:  ferric subsulfate Outcome: patient tolerated procedure well with no complications   Additional details:  Wound innoculated with 5 fluorouracil solution.      I, [name], MD, have reviewed all documentation for this visit.  The documentation on 12/02/20 for the exam, diagnosis, procedures, and orders are all accurate and complete.

## 2020-12-17 ENCOUNTER — Other Ambulatory Visit: Payer: Self-pay

## 2020-12-17 ENCOUNTER — Encounter: Payer: Self-pay | Admitting: Internal Medicine

## 2020-12-17 ENCOUNTER — Ambulatory Visit (INDEPENDENT_AMBULATORY_CARE_PROVIDER_SITE_OTHER): Payer: Medicare Other | Admitting: Internal Medicine

## 2020-12-17 VITALS — BP 122/76 | HR 54 | Ht 68.0 in | Wt 156.4 lb

## 2020-12-17 DIAGNOSIS — I480 Paroxysmal atrial fibrillation: Secondary | ICD-10-CM

## 2020-12-17 DIAGNOSIS — Z01812 Encounter for preprocedural laboratory examination: Secondary | ICD-10-CM

## 2020-12-17 DIAGNOSIS — R072 Precordial pain: Secondary | ICD-10-CM

## 2020-12-17 NOTE — Progress Notes (Signed)
Patient Care Team: Eber Hong, MD as PCP - General (Internal Medicine) [name], MD as Consulting Physician (Dermatology)   HPI  Nicholas Bolton is a 70 y.o. male Seen in followup for paroxysmal atrial fibrillation takes Rythmol started 1994     Palpitations 4/21  ;lasted minutes, using apple watch>> AIVR    Thromboembolic risk factors ( age -29, HTN-2 ) for a CHADSVASc Score of >=2   Myoview scanning 2009 at work hospital was normal. DATE TEST EF   /2009 Myoview   Normal  7/19 CaScore   % 2        Recurrent palpitations associated with lightheadedness.  Ended up again identifying AIVR on some of the electrocardiogram but not necessarily associated with symptoms  Otherwise without shortness of breath peripheral edema; intermittent midsternal chest discomfort described as indigestion versus pressure.  Not necessarily associated with exertion.  Typically about 5 minutes.   DATE PR interval QRSduration Propafenone   10/10 202 120 0  1/20 234 102 150 x3  9/21 222 124   6/22 202 108 150 x 3   Date Cr K TSH Hgb  7/20 1.07 4.4  14.2  2/21      4.22       Ca score 2  MS quiescient  Past Medical History:  Diagnosis Date   A-fib ([facility])    Anxiety    Asthma    GERD (gastroesophageal reflux disease)    MS (multiple sclerosis) ([facility])    Prostate cancer ([place]) 03/2020    Past Surgical History:  Procedure Laterality Date   EYE SURGERY     HERNIA REPAIR     SHOULDER SURGERY      Current Outpatient Medications  Medication Sig Dispense Refill   atorvastatin (LIPITOR) 10 MG tablet Take by mouth.     B Complex-Folic Acid (B COMPLEX-VITAMIN B12 PO) Take 1 tablet by mouth daily.     Cholecalciferol (VITAMIN D3) 25 MCG (1000 UT) CAPS Take 2,000 Units by mouth daily.     famotidine (PEPCID) 20 MG tablet Take by mouth.     lisinopril (PRINIVIL,ZESTRIL) 5 MG tablet Take 1 tablet by mouth daily.     loratadine (CLARITIN) 10 MG tablet Take 10 mg by mouth daily.      propafenone (RYTHMOL) 150 MG tablet Take 150 mg by mouth every 8 (eight) hours.     sodium chloride (MURO 128) 5 % ophthalmic ointment Place 0.5 application into both eyes at bedtime as needed.     SOOLANTRA 1 % CREA Apply 1 application topically daily. to face     atorvastatin (LIPITOR) 10 MG tablet Take 1 tablet by mouth daily.     famotidine (PEPCID) 20 MG tablet Take 1 tablet by mouth daily.     No current facility-administered medications for this visit.    Allergies  Allergen Reactions   Penicillins     Blood in urine    Procainamide Rash    "pt went crazy" Nightmares and made feel like a vice on head.   Clindamycin Hives   Penicillin G Other (See Comments)    hematuria   Cephalexin Other (See Comments) and Rash    Rash Rash     Review of Systems negative except from HPI and PMH  Physical Exam BP 122/76   Pulse (!) 54   Ht '5\' 8"'$  (1.727 m)   Wt 156 lb 6.4 oz (70.9 kg)   SpO2 97%   BMI 23.78 kg/m  Well developed and  nourished in no acute distress HENT normal Neck supple with JVP-  flat   Clear Regular rate and rhythm, no murmurs or gallops Abd-soft with active BS No Clubbing cyanosis edema Skin-warm and dry A & Oriented  Grossly normal sensory and motor function  ECG sinus at 54 Interval 20/11/43  [employer] watch strips reviewed.  AIVR. Assessment and  Plan  Atrial fibrillation-paroxysmal/SCAF  Sinus bradycardia  AIVR  Lightheadedness with palpitations  Hypertension  Hyperlipidemia    Lightheadedness with palpitations in the context of documented AIVR.  We will need to try to make the clear association; I have encouraged him to use his apple watch to record with his symptoms.  For now we will continue propafenone 150 vq 8  Chest discomfort is somewhat concerning.  Should be low risk given his calcium score only 2 3+ years ago; however, given the quality of his symptoms however, we will undertake a CTA.  We will continue him on his statin and  anticipate the use of aspirin  Atrial fibrillation sufficiently infrequent, substantiated by the lack of identified atrial fibrillation on his apple watch.  Hence, we will not use anticoagulation

## 2020-12-17 NOTE — Patient Instructions (Addendum)
Medication Instructions:  Your physician recommends that you continue on your current medications as directed. Please refer to the Current Medication list given to you today.  *If you need a refill on your cardiac medications before your next appointment, please call your pharmacy*   Lab Work: None ordered.  If you have labs (blood work) drawn today and your tests are completely normal, you will receive your results only by: [place] (if you have MyChart) OR A paper copy in the mail If you have any lab test that is abnormal or we need to change your treatment, we will call you to review the results.   Testing/Procedures: Your physician has requested that you have cardiac CT. Cardiac computed tomography (CT) is a painless test that uses an x-ray machine to take clear, detailed pictures of your heart. For further information please visit [email or web address]. Please follow instruction sheet as given.     Follow-Up: At [facility], you and your health needs are our priority.  As part of our continuing mission to provide you with exceptional heart care, we have created designated Provider Care Teams.  These Care Teams include your primary Cardiologist (physician) and Advanced Practice Providers (APPs -  Physician Assistants and Nurse Practitioners) who all work together to provide you with the care you need, when you need it.  We recommend signing up for the patient portal called "MyChart".  Sign up information is provided on this After Visit Summary.  MyChart is used to connect with patients for Virtual Visits (Telemedicine).  Patients are able to view lab/test results, encounter notes, upcoming appointments, etc.  Non-urgent messages can be sent to your provider as well.   To learn more about what you can do with MyChart, go to [email or web address].    Your next appointment:   12 months  The format for your next appointment:   In Person  Provider:   Virl Axe,  MD    Your cardiac CT will be scheduled at one of the below locations:   [facility] [address] [place], [place] [address] [phone]   If scheduled at [facility], please arrive at the [facility] main entrance (entrance A) of [facility] 30 minutes prior to test start time. Proceed to the [facility] Radiology Department (first floor) to check-in and test prep.   Please follow these instructions carefully (unless otherwise directed):  Hold all erectile dysfunction medications at least 3 days (72 hrs) prior to test.  On the Night Before the Test: Be sure to Drink plenty of water. Do not consume any caffeinated/decaffeinated beverages or chocolate 12 hours prior to your test. Do not take any antihistamines 12 hours prior to your test.   On the Day of the Test: Drink plenty of water until 1 hour prior to the test. Do not eat any food 4 hours prior to the test. You may take your regular medications prior to the test.  Take metoprolol (Lopressor) two hours prior to test. HOLD Furosemide/Hydrochlorothiazide morning of the test. FEMALES- please wear underwire-free bra if available, avoid dresses & tight clothing        After the Test: Drink plenty of water. After receiving IV contrast, you may experience a mild flushed feeling. This is normal. On occasion, you may experience a mild rash up to 24 hours after the test. This is not dangerous. If this occurs, you can take Benadryl 25 mg and increase your fluid intake. If you experience trouble breathing, this  can be serious. If it is severe call 911 IMMEDIATELY. If it is mild, please call our office. If you take any of these medications: Glipizide/Metformin, Avandament, Glucavance, please do not take 48 hours after completing test unless otherwise instructed.  Please allow 2-4 weeks for scheduling of routine cardiac CTs. Some insurance companies require a pre-authorization which may delay scheduling  of this test.   For non-scheduling related questions, please contact the cardiac imaging nurse navigator should you have any questions/concerns: Marchia Bond, Cardiac Imaging Nurse Navigator Gordy Clement, Cardiac Imaging Nurse Navigator [facility] Direct Office Dial: [phone]   For scheduling needs, including cancellations and rescheduling, please call [place], [phone].

## 2020-12-30 ENCOUNTER — Telehealth (HOSPITAL_COMMUNITY): Payer: Self-pay | Admitting: Emergency Medicine

## 2020-12-30 NOTE — Telephone Encounter (Signed)
Reaching out to patient to offer assistance regarding upcoming cardiac imaging study; pt verbalizes understanding of appt date/time, parking situation and where to check in, pre-test NPO status and medications ordered, and verified current allergies; name and call back number provided for further questions should they arise Nijah Orlich RN Navigator Cardiac Imaging [facility] [phone] office [phone] cell  Denies iv issues Denies claustro  

## 2021-01-01 ENCOUNTER — Other Ambulatory Visit: Payer: Self-pay | Admitting: Cardiovascular Disease

## 2021-01-01 ENCOUNTER — Ambulatory Visit (HOSPITAL_COMMUNITY)
Admission: RE | Admit: 2021-01-01 | Discharge: 2021-01-01 | Disposition: A | Discharge status: Home / Self Care | Payer: Medicare Other | Source: Ambulatory Visit | Attending: Internal Medicine | Admitting: Internal Medicine

## 2021-01-01 ENCOUNTER — Encounter (HOSPITAL_COMMUNITY): Payer: Self-pay

## 2021-01-01 ENCOUNTER — Other Ambulatory Visit: Payer: Self-pay

## 2021-01-01 ENCOUNTER — Ambulatory Visit (HOSPITAL_COMMUNITY)
Admission: RE | Admit: 2021-01-01 | Discharge: 2021-01-01 | Disposition: A | Discharge status: Home / Self Care | Payer: Medicare Other | Source: Ambulatory Visit | Attending: Cardiovascular Disease | Admitting: Cardiovascular Disease

## 2021-01-01 DIAGNOSIS — R072 Precordial pain: Secondary | ICD-10-CM | POA: Diagnosis present

## 2021-01-01 DIAGNOSIS — R931 Abnormal findings on diagnostic imaging of heart and coronary circulation: Secondary | ICD-10-CM

## 2021-01-01 DIAGNOSIS — I2510 Atherosclerotic heart disease of native coronary artery without angina pectoris: Secondary | ICD-10-CM | POA: Diagnosis not present

## 2021-01-01 MED ORDER — NITROGLYCERIN 0.4 MG SL SUBL
0.8000 mg | SUBLINGUAL_TABLET | Freq: Once | SUBLINGUAL | Status: AC
  Administered 2021-01-01: 0.8 mg via SUBLINGUAL

## 2021-01-01 MED ORDER — IOHEXOL 350 MG/ML SOLN
95.0000 mL | Freq: Once | INTRAVENOUS | Status: AC | PRN
  Administered 2021-01-01: 95 mL via INTRAVENOUS

## 2021-01-01 MED ORDER — NITROGLYCERIN 0.4 MG SL SUBL
SUBLINGUAL_TABLET | SUBLINGUAL | Status: AC
  Filled 2021-01-01: qty 2

## 2021-01-02 DIAGNOSIS — I2510 Atherosclerotic heart disease of native coronary artery without angina pectoris: Secondary | ICD-10-CM | POA: Diagnosis not present

## 2021-01-21 ENCOUNTER — Telehealth: Payer: Self-pay

## 2021-01-21 NOTE — Telephone Encounter (Signed)
-----   Message from Deboraha Sprang, MD sent at 01/10/2021  5:16 PM EDT ----- Please Inform Patient  Labs are normal x mild calcium, but FFR was neg; reasonable to continue statin and would begn ASA 81 mg   Thanks

## 2021-01-21 NOTE — Telephone Encounter (Signed)
Attempted phone call to pt.  Per Epic ok to leave detailed voicemail message.  Pt advised per Dr Caryl Comes scan shows mild calcium but otherwise negative.  Continue statin and begin ASA 81mg  - 1 tablet by mouth daily.  Please contact RN at [phone] for further questions or concerns.

## 2021-03-19 ENCOUNTER — Encounter: Payer: Self-pay | Admitting: Dermatology

## 2021-03-19 ENCOUNTER — Other Ambulatory Visit: Payer: Self-pay

## 2021-03-19 ENCOUNTER — Ambulatory Visit (INDEPENDENT_AMBULATORY_CARE_PROVIDER_SITE_OTHER): Payer: Medicare Other | Admitting: Dermatology

## 2021-03-19 DIAGNOSIS — L719 Rosacea, unspecified: Secondary | ICD-10-CM

## 2021-03-19 DIAGNOSIS — L570 Actinic keratosis: Secondary | ICD-10-CM | POA: Diagnosis not present

## 2021-03-19 DIAGNOSIS — Z85828 Personal history of other malignant neoplasm of skin: Secondary | ICD-10-CM

## 2021-03-19 DIAGNOSIS — Z86007 Personal history of in-situ neoplasm of skin: Secondary | ICD-10-CM

## 2021-03-19 MED ORDER — IMIQUIMOD 5 % EX CREA: TOPICAL_CREAM | CUTANEOUS | 0 refills | Status: AC

## 2021-04-06 ENCOUNTER — Encounter: Payer: Self-pay | Admitting: Dermatology

## 2021-04-06 NOTE — Progress Notes (Signed)
° °  Follow-Up Visit   Subjective  Nicholas Bolton is a 70 y.o. 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 Per patient he has a crusty lesion above his left eyebrow and right eyebrow x 1 year no bleeding. Patient would also like his face checked per patient he believes the mask is making his face worse with redness and irritation. Per patient he also has a recurrent lesion on his left frontal scalp. ).  Check site of skin cancer, other growths on face, redness seems to be getting worse Location:  Duration:  Quality:  Associated Signs/Symptoms: Modifying Factors:  Severity:  Timing: Context:   Objective  Well appearing patient in no apparent distress; mood and affect are within normal limits. Head - Anterior (Face) 5 mm gritty pink crusts could represent actinic keratoses or early carcinoma in situ.  We will initially try to clear with topical Aldara.  Head Called to distinguish inflammation such as rosacea/seborrheic dermatitis from nose diffuse UV damage.    A focused examination was performed including head and neck. Relevant physical exam findings are noted in the Assessment and Plan.   Assessment & Plan    AK (actinic keratosis) Head - Anterior (Face)  Aldara, apply Monday Wednesday Friday for 6 to 8 weeks or until there is brisk inflammation.  He is to contact me if there is too much reaction.  I will likely begin after the holidays.  imiquimod (ALDARA) 5 % cream - Head - Anterior (Face) Apply to affected area Monday, Wednesday and Friday x 6 weeks.  Rosacea Head  We will hold on soolantra and not yet substitute alternative topical therapy.  May do a 1 to 59-month trial with an oral antibiotic in the future.      I, [name], MD, have reviewed all documentation for this visit.  The documentation on 04/06/21 for the exam, diagnosis, procedures, and orders are all accurate and  complete.

## 2021-06-24 ENCOUNTER — Other Ambulatory Visit: Payer: Self-pay

## 2021-06-24 ENCOUNTER — Ambulatory Visit (INDEPENDENT_AMBULATORY_CARE_PROVIDER_SITE_OTHER): Payer: Medicare Other | Admitting: Dermatology

## 2021-06-24 DIAGNOSIS — L719 Rosacea, unspecified: Secondary | ICD-10-CM | POA: Diagnosis not present

## 2021-06-24 DIAGNOSIS — L570 Actinic keratosis: Secondary | ICD-10-CM

## 2021-06-24 MED ORDER — SOOLANTRA 1 % EX CREA: 1.0000 "application " | TOPICAL_CREAM | Freq: Every day | CUTANEOUS | 5 refills | Status: AC

## 2021-06-26 ENCOUNTER — Telehealth: Payer: Self-pay | Admitting: Dermatology

## 2021-06-26 NOTE — Telephone Encounter (Signed)
Irving left message on office voice mail  ?[facility] [phone] - [place], [place] (Ph: [phone]) ?Per message they received a prescription for ?Soolantra on 06/24/2021 ?SOOLANTRA 1 % CREA ?Per pharmacy it is for brand only, but the coupon that the patient has is for the generic (Ivermectin) and that is in stock and much cheaper.  Pharmacy wants to know if they can substitute the generic Ivermection. ?

## 2021-06-26 NOTE — Telephone Encounter (Signed)
Phone call to patient's Pharmacy to inform them that it's okay to substitution for the Ivermectin. Pharmacy aware.  ?

## 2021-07-05 ENCOUNTER — Encounter: Payer: Self-pay | Admitting: Dermatology

## 2021-07-05 NOTE — Progress Notes (Signed)
? ?  Follow-Up Visit ?  ?Subjective  ?Nicholas Bolton is a 71 y.o. male who presents for the following: Follow-up (Pt here to f/u ok Aks on the face. Pt was supposed to do a round ok imiquimod and did not do it due to a trip and pt knew he was going to be in the sun. ). ? ?Multiple sun exposed crusts plus breaking out on face ?Location:  ?Duration:  ?Quality:  ?Associated Signs/Symptoms: ?Modifying Factors:  ?Severity:  ?Timing: ?Context:  ? ?Objective  ?Well appearing patient in no apparent distress; mood and affect are within normal limits. ?Head - Anterior (Face) ?Significant inflammatory edematous erythema central face. ? ?Left Forehead, Left Nasal Sidewall, Left Parotid Area, Right Buccal Cheek, Right Melolabial Fold, Right Mid Helix, Right Zygomatic Area ?Half dozen moderately thick 3 to 5 mm gritty crusts ? ? ? ?All sun exposed areas plus back examined. ? ? ?Assessment & Plan  ? ? ?Rosacea ?Head - Anterior (Face) ? ?Essentially all treatment options discussed including topicals, low-dose oral antibiotics, and isotretinoin.  If out-of-pocket cost is not high, he will use Soolantra or generic ivermectin nightly for 6 to 8 weeks and contact me with status update ? ?SOOLANTRA 1 % CREA - Head - Anterior (Face) ?Apply 1 application. topically daily. to face ? ?Actinic keratosis (7) ?Right Mid Helix; Left Nasal Sidewall; Right Melolabial Fold; Right Zygomatic Area; Left Parotid Area; Right Buccal Cheek; Left Forehead ? ?Destruction of lesion - Left Forehead, Left Nasal Sidewall, Left Parotid Area, Right Buccal Cheek, Right Melolabial Fold, Right Mid Helix, Right Zygomatic Area ?Complexity: simple   ?Destruction method: cryotherapy   ?Informed consent: discussed and consent obtained   ?Timeout:  patient name, date of birth, surgical site, and procedure verified ?Lesion destroyed using liquid nitrogen: Yes   ?Cryotherapy cycles:  5 ?Outcome: patient tolerated procedure well with no complications   ?Post-procedure  details: wound care instructions given   ? ? ? ? ? ?I, Nicholas Monarch, MD, have reviewed all documentation for this visit.  The documentation on 07/05/21 for the exam, diagnosis, procedures, and orders are all accurate and complete. ?

## 2021-12-30 ENCOUNTER — Ambulatory Visit: Payer: Medicare Other | Admitting: Dermatology
